# Patient Record
Sex: Female | Born: 1975 | Hispanic: Yes | Marital: Single | Smoking: Never smoker
Health system: Southern US, Community
[De-identification: ages and names within clinical notes are randomized; demographics above are authoritative.]

---

## 2015-01-11 ENCOUNTER — Encounter (HOSPITAL_COMMUNITY): Payer: Self-pay | Admitting: *Deleted

## 2015-01-11 ENCOUNTER — Inpatient Hospital Stay (HOSPITAL_COMMUNITY)
Admission: AD | Admit: 2015-01-11 | Discharge: 2015-01-12 | DRG: 779 | Disposition: A | Discharge status: Home / Self Care | Payer: Medicaid Other | Source: Ambulatory Visit | Attending: Obstetrics & Gynecology | Admitting: Obstetrics & Gynecology

## 2015-01-11 ENCOUNTER — Inpatient Hospital Stay (HOSPITAL_COMMUNITY): Payer: Medicaid Other

## 2015-01-11 DIAGNOSIS — O09529 Supervision of elderly multigravida, unspecified trimester: Secondary | ICD-10-CM

## 2015-01-11 DIAGNOSIS — IMO0002 Reserved for concepts with insufficient information to code with codable children: Secondary | ICD-10-CM

## 2015-01-11 DIAGNOSIS — Z603 Acculturation difficulty: Secondary | ICD-10-CM

## 2015-01-11 DIAGNOSIS — Z3A18 18 weeks gestation of pregnancy: Secondary | ICD-10-CM | POA: Diagnosis present

## 2015-01-11 DIAGNOSIS — Z789 Other specified health status: Secondary | ICD-10-CM

## 2015-01-11 DIAGNOSIS — O021 Missed abortion: Principal | ICD-10-CM

## 2015-01-11 LAB — URINALYSIS, ROUTINE W REFLEX MICROSCOPIC
Bilirubin Urine: NEGATIVE
Glucose, UA: NEGATIVE mg/dL
Ketones, ur: NEGATIVE mg/dL
NITRITE: NEGATIVE
Protein, ur: NEGATIVE mg/dL
SPECIFIC GRAVITY, URINE: 1.025 (ref 1.005–1.030)
Urobilinogen, UA: 0.2 mg/dL (ref 0.0–1.0)
pH: 6 (ref 5.0–8.0)

## 2015-01-11 LAB — URINE MICROSCOPIC-ADD ON

## 2015-01-11 LAB — CBC
HCT: 39.3 % (ref 36.0–46.0)
HEMOGLOBIN: 13.5 g/dL (ref 12.0–15.0)
MCH: 33.2 pg (ref 26.0–34.0)
MCHC: 34.4 g/dL (ref 30.0–36.0)
MCV: 96.6 fL (ref 78.0–100.0)
Platelets: 171 10*3/uL (ref 150–400)
RBC: 4.07 MIL/uL (ref 3.87–5.11)
RDW: 13.4 % (ref 11.5–15.5)
WBC: 10.2 10*3/uL (ref 4.0–10.5)

## 2015-01-11 LAB — WET PREP, GENITAL
Trich, Wet Prep: NONE SEEN
Yeast Wet Prep HPF POC: NONE SEEN

## 2015-01-11 LAB — POCT PREGNANCY, URINE: PREG TEST UR: POSITIVE — AB

## 2015-01-11 LAB — ABO/RH: ABO/RH(D): O POS

## 2015-01-11 IMAGING — US US OB COMP +14 WK
1 series · 13 of 28 positions shown · non-contrast
Comparison: none

[Series 1: us ob comp +14 wk mfm · 31 acquisitions, 13 frames shown]
[im 2/31]
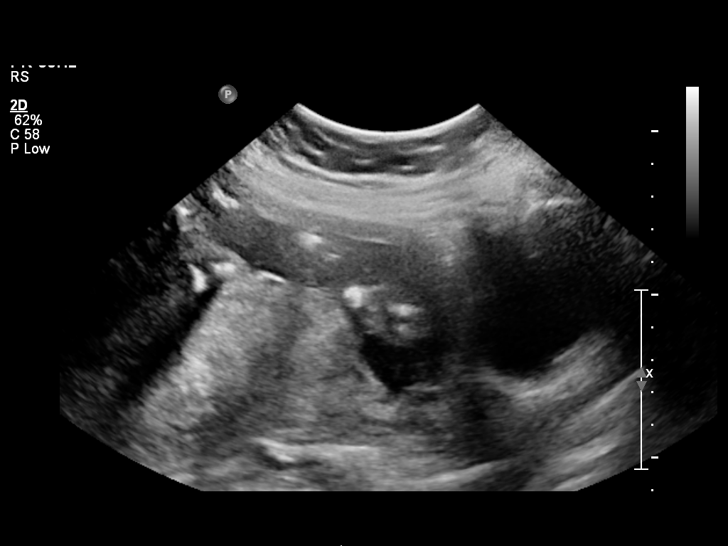
[im 4/31]
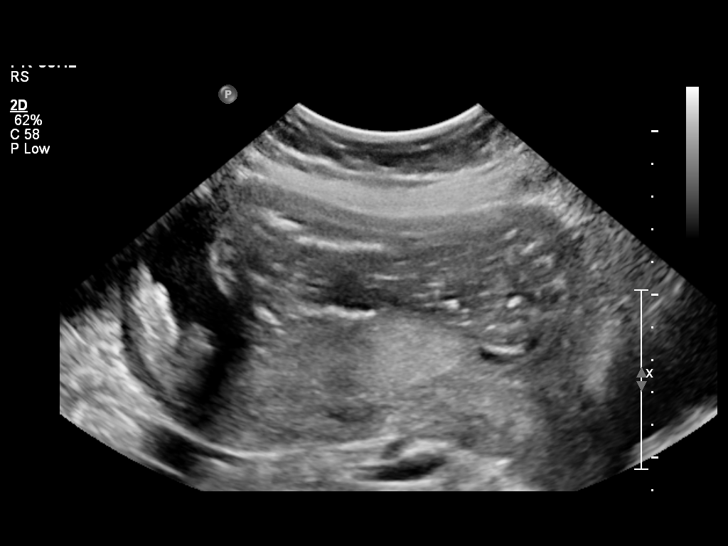
[im 6/31]
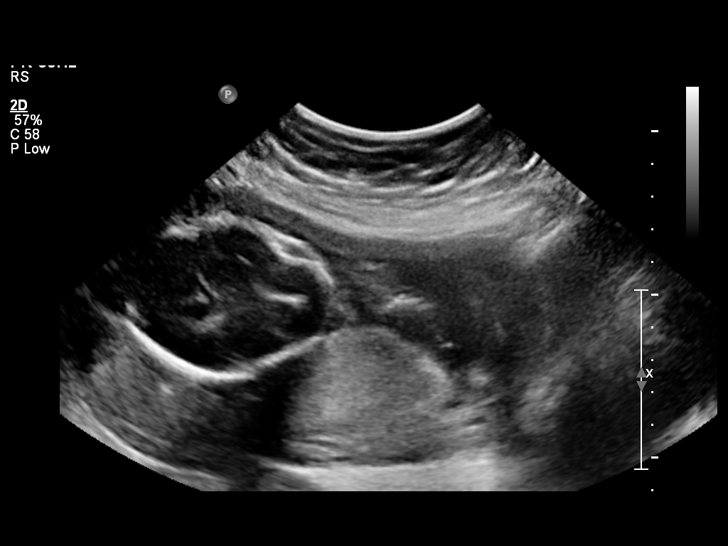
[im 8/31]
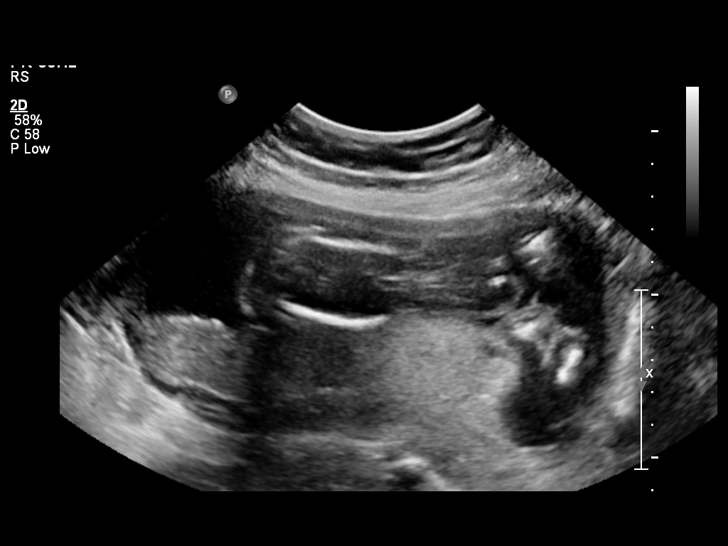
[im 11/31]
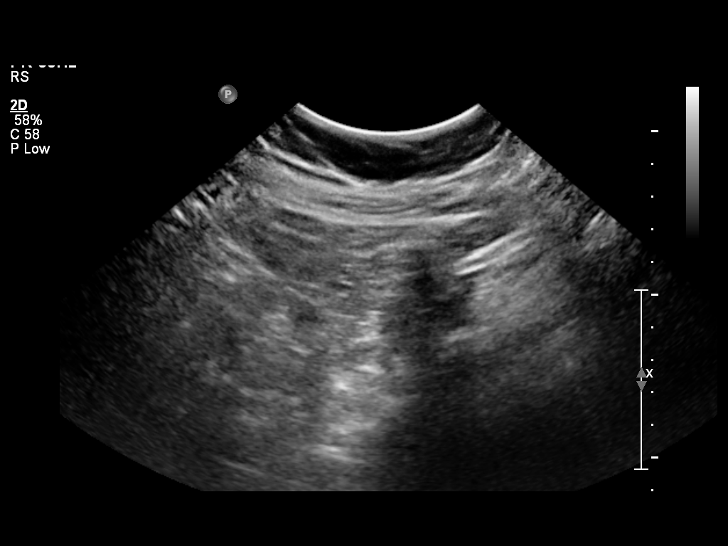
[im 13/31]
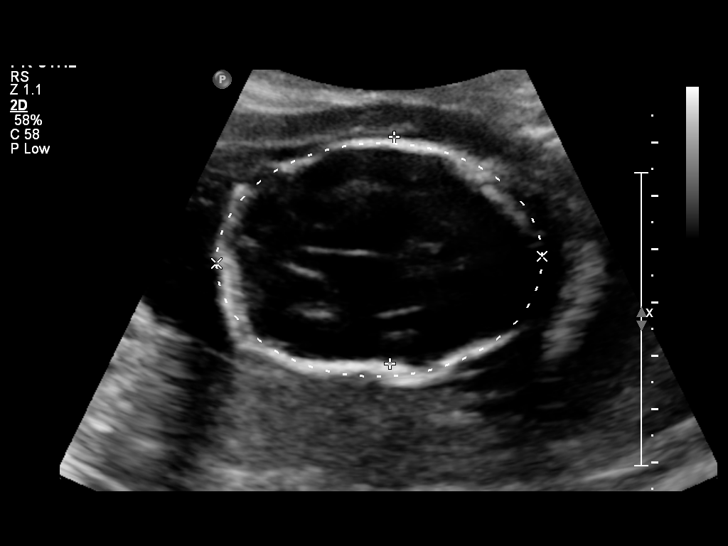
[im 16/31]
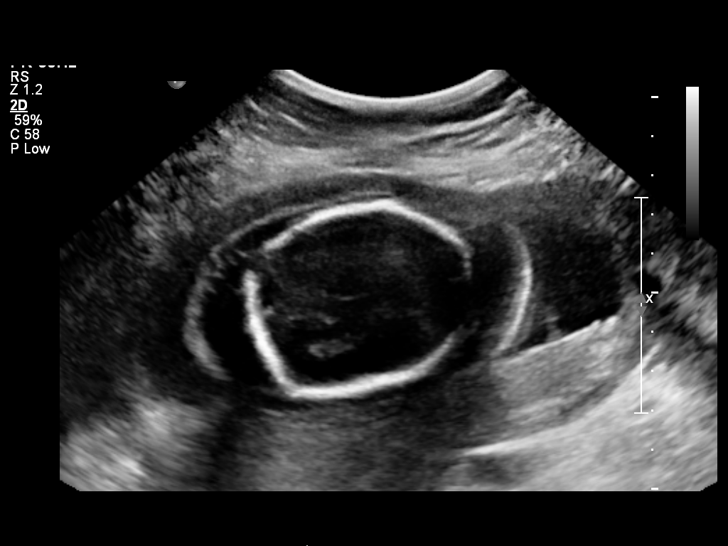
[im 18/31]
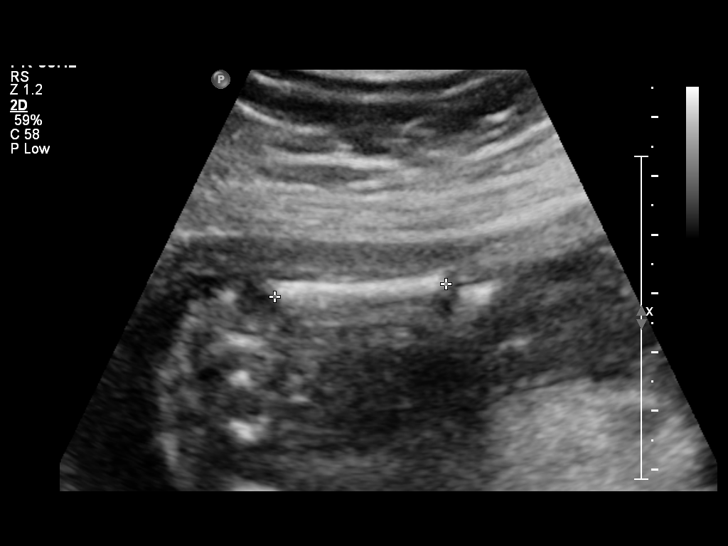
[im 21/31]
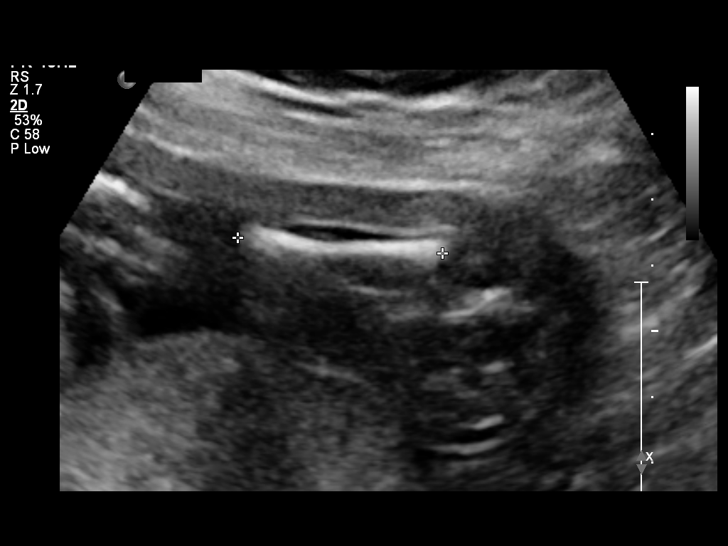
[im 23/31]
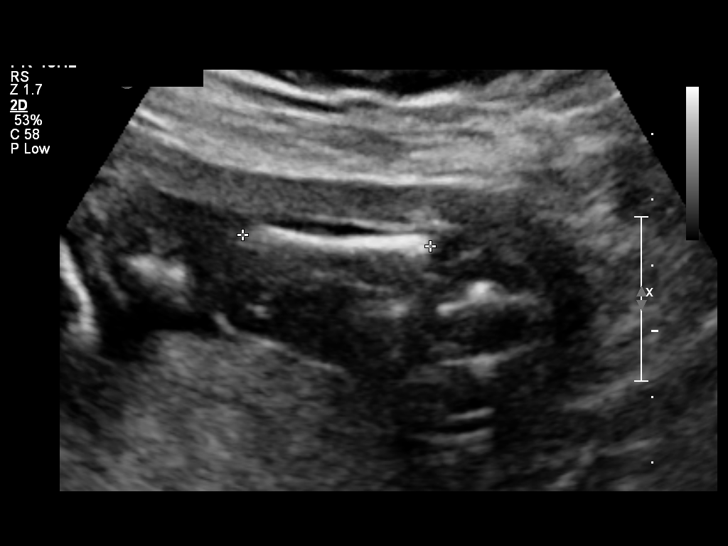
[im 25/31]
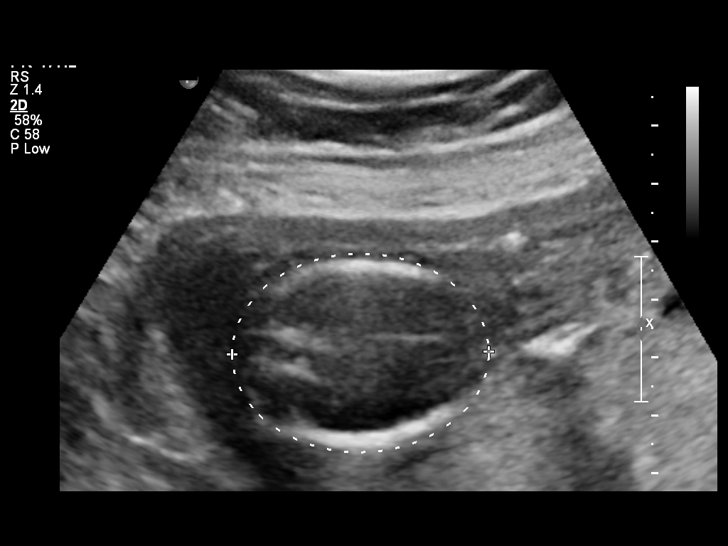
[im 27/31]
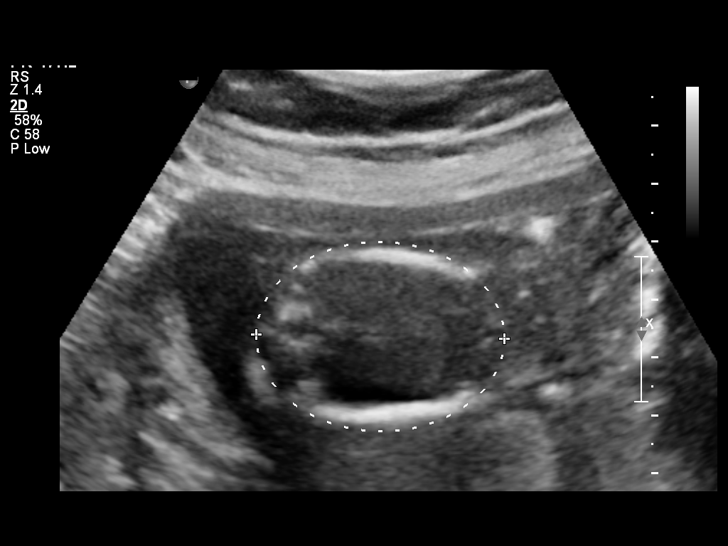
[im 29/31]
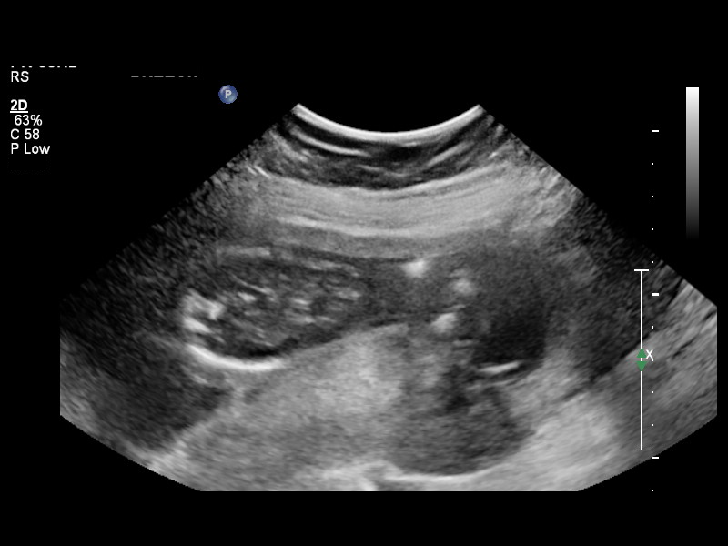

[13 of 28 positions shown; findings below may reference images not displayed]

OBSTETRICS REPORT
                      (Signed Final 01/11/2015 [DATE])

             ROBLES

                                                         PA
Service(s) Provided

 US OB COMP + 14 WK                                    76805.1
Indications

 18 weeks gestation of pregnancy
 Advanced maternal age multigravida 35+, second
 trimester
 Vaginal bleeding in pregnancy, second trimester
 Abdominal pain in pregnancy
 Absent fetal heart tones
 Fetal demise less than 22 weeks
Fetal Evaluation

 Num Of Fetuses:    1
 Cardiac Activity:  Absent
 Presentation:      Breech
 Placenta:          Posterior, above cervical
                    os

 Amniotic Fluid
 AFI FV:      Subjectively within normal limits
                                             Larg Pckt:     4.2  cm
Biometry

 BPD:     42.5  mm     G. Age:  18w 6d                CI:         65.5   70 - 86
                                                      FL/HC:      17.7   16.1 -

 HC:     168.6  mm     G. Age:  19w 4d       73   %   HC/AC:      1.39   1.09 -

 AC:     121.5  mm     G. Age:  17w 6d       17   %   FL/BPD:
 FL:      29.9  mm     G. Age:  19w 2d       57   %   FL/AC:      24.6   20 - 24
 HUM:     29.8  mm     G. Age:  19w 6d       75   %

 Est. FW:     248   gm     0 lb 9 oz     40  %
Gestational Age

 U/S Today:     18w 6d                                        EDD:   06/08/15
 Best:          18w 6d     Det. By:  U/S (01/11/15)           EDD:   06/08/15
Cervix Uterus Adnexa
 Cervical Length:    2.8       cm

 Cervix:       Normal appearance by transabdominal scan.

 Left Ovary:    Not visualized. No adnexal mass visualized.
 Right Ovary:   Not visualized. No adnexal mass visualized.
Impression

 Singleton intrauterine pregnancy at 18 weeks 6 days
 gestation with NO fetal cardiac activity
 Normal appearing fetal growth with subjectively low amniotic
 fluid volume
 Breech presentation
 Posterior placenta without evidence of previa
 Edema of the fetal scalp noted without evidence of cystic
 hygroma
Recommendations

 Follow-up as clinically indicated for second trimester fetal
 demise

                  Siffer, Kiss Attila

## 2015-01-11 MED ORDER — KETOROLAC TROMETHAMINE 60 MG/2ML IM SOLN
60.0000 mg | Freq: Once | INTRAMUSCULAR | Status: AC
  Administered 2015-01-11: 60 mg via INTRAMUSCULAR
  Filled 2015-01-11: qty 2

## 2015-01-11 MED ORDER — LORAZEPAM 1 MG PO TABS
1.0000 mg | ORAL_TABLET | Freq: Once | ORAL | Status: AC
  Administered 2015-01-11: 1 mg via ORAL
  Filled 2015-01-11: qty 1

## 2015-01-11 MED ORDER — MISOPROSTOL 200 MCG PO TABS
200.0000 ug | ORAL_TABLET | ORAL | Status: DC
  Administered 2015-01-11 – 2015-01-12 (×3): 200 ug via VAGINAL
  Filled 2015-01-11 (×3): qty 1

## 2015-01-11 MED ORDER — DEXTROSE IN LACTATED RINGERS 5 % IV SOLN
INTRAVENOUS | Status: DC
  Administered 2015-01-11: 22:00:00 via INTRAVENOUS

## 2015-01-11 MED ORDER — PROMETHAZINE HCL 25 MG/ML IJ SOLN: 25.0000 mg | Freq: Four times a day (QID) | INTRAMUSCULAR | Status: DC | PRN

## 2015-01-11 MED ORDER — HYDROMORPHONE HCL 1 MG/ML IJ SOLN
2.0000 mg | INTRAMUSCULAR | Status: DC | PRN
  Administered 2015-01-12: 2 mg via INTRAVENOUS
  Filled 2015-01-11: qty 2

## 2015-01-11 NOTE — MAU Note (Signed)
Pain from back around to front. Feels like she needs to have BM,doesn't actually go. Having brown d/c also started 2 days ago- has gotten heavier.

## 2015-01-11 NOTE — Progress Notes (Signed)
Up to BR. Philipp DeputyKim Shaw, CNM to come and see patient.

## 2015-01-11 NOTE — MAU Note (Signed)
Arrived from [place] 2 months ago.  No care here in [place]. Other babies all c/s

## 2015-01-11 NOTE — Progress Notes (Signed)
Philipp DeputyKim Shaw, CNM in to see patient. No FHT's detected with doppler. Bonnye FavaViria, interpretor present for discussion regarding current status and plan of care. Informed patient she will have an U/S to determine her gestational age.

## 2015-01-11 NOTE — Progress Notes (Signed)
I assisted Software engineerusan RN with some questions, by Orlan LeavensViria Alvarez Spanish Interpreter

## 2015-01-11 NOTE — MAU Provider Note (Signed)
History     CSN: [id number]  Arrival date and time: 01/11/15 1542   First Provider Initiated Contact with Patient 01/11/15 1924      Chief Complaint  Patient presents with  . Vaginal Discharge  . Abdominal Pain  . Back Pain   HPI Comments: Teresa Espinoza 38 y.o. [id number] 18 weeks 6 days pregnant with brown discharge x 2 days and bright red bleeding today. She also has pelvic pains 4/10 and would like something for pain. [facility]. There is some discrepancy with her dating. She does not speak [place] and an interpreter was used.   Vaginal Discharge Associated symptoms include abdominal pain and back pain.  Abdominal Pain  Back Pain Associated symptoms include abdominal pain.      History reviewed. No pertinent past medical history.  Past Surgical History  Procedure Laterality Date  . Cesarean section      No family history on file.  History  Substance Use Topics  . Smoking status: Never Smoker   . Smokeless tobacco: Never Used  . Alcohol Use: No    Allergies:  Allergies  Allergen Reactions  . Penicillins Rash    Rash and itching    Prescriptions prior to admission  Medication Sig Dispense Refill Last Dose  . Prenatal Vit-Fe Fumarate-FA (PRENATAL MULTIVITAMIN) TABS tablet Take 1 tablet by mouth daily at 12 noon.   01/11/2015 at Unknown time    Review of Systems  Constitutional: Negative.   HENT: Negative.   Eyes: Negative.   Respiratory: Negative.   Cardiovascular: Negative.   Gastrointestinal: Positive for abdominal pain.  Genitourinary: Negative.   Musculoskeletal: Positive for back pain.  Skin: Negative.   Neurological: Negative.   Psychiatric/Behavioral: Negative.    Physical Exam   Blood pressure 139/80, pulse 74, temperature 98.7 F (37.1 C), temperature source Oral, resp. rate 18, height 4' 9.5" (1.461 m), weight 73.029 kg (161 lb), SpO2 100 %.  Physical Exam  Constitutional: She is  oriented to person, place, and time. She appears well-developed and well-nourished. No distress.  Tearful  HENT:  Head: Normocephalic and atraumatic.  Eyes: Pupils are equal, round, and reactive to light.  GI: Soft. She exhibits no distension. There is no tenderness. There is no rebound.  Genitourinary:  Genital: external may have some surgical incisions  Vaginal: small amount bleeding Cervix: closed/ thick Bimanual: pregnant approx 20 weeks   Musculoskeletal: Normal range of motion.  Neurological: She is alert and oriented to person, place, and time.  Skin: Skin is warm and dry.  Psychiatric: She has a normal mood and affect. Her behavior is normal. Judgment and thought content normal.   Results for orders placed or performed during the hospital encounter of 01/11/15 (from the past 24 hour(s))  Urinalysis, Routine w reflex microscopic     Status: Abnormal   Collection Time: 01/11/15  4:05 PM  Result Value Ref Range   Color, Urine YELLOW YELLOW   APPearance CLEAR CLEAR   Specific Gravity, Urine 1.025 1.005 - 1.030   pH 6.0 5.0 - 8.0   Glucose, UA NEGATIVE NEGATIVE mg/dL   Hgb urine dipstick LARGE (A) NEGATIVE   Bilirubin Urine NEGATIVE NEGATIVE   Ketones, ur NEGATIVE NEGATIVE mg/dL   Protein, ur NEGATIVE NEGATIVE mg/dL   Urobilinogen, UA 0.2 0.0 - 1.0 mg/dL   Nitrite NEGATIVE NEGATIVE   Leukocytes, UA MODERATE (A) NEGATIVE  Urine microscopic-add on     Status: Abnormal   Collection Time:  01/11/15  4:05 PM  Result Value Ref Range   Squamous Epithelial / LPF MANY (A) RARE   WBC, UA 7-10 <3 WBC/hpf   RBC / HPF 3-6 <3 RBC/hpf   Bacteria, UA MANY (A) RARE   Urine-Other MUCOUS PRESENT   Pregnancy, urine POC     Status: Abnormal   Collection Time: 01/11/15  4:55 PM  Result Value Ref Range   Preg Test, Ur POSITIVE (A) NEGATIVE  Wet prep, genital     Status: Abnormal   Collection Time: 01/11/15  8:00 PM  Result Value Ref Range   Yeast Wet Prep HPF POC NONE SEEN NONE SEEN    Trich, Wet Prep NONE SEEN NONE SEEN   Clue Cells Wet Prep HPF POC FEW (A) NONE SEEN   WBC, Wet Prep HPF POC FEW (A) NONE SEEN     MAU Course  Procedures  MDM CBC, ABORh, HIV, GC, Chlamydia Toradol 60 mg IM Ativan 1 mg po Spoke with Dr Despina Hidden who advised admission for Cytotec Pt agreed to have Cytotec [name] at bedside  Assessment and Plan   A: [redacted]w[redacted]d demise  P: Admit to AICU for Cytotec   Carolynn Serve 01/11/2015, 8:04 PM

## 2015-01-11 NOTE — Progress Notes (Signed)
No FHT's detected. No uterine fundus palpated. Patient states she is 32 weeks. States [place], was told everything was normal.

## 2015-01-11 NOTE — Progress Notes (Signed)
Patient gave permission for sister to have a work note in her name.

## 2015-01-12 ENCOUNTER — Encounter (HOSPITAL_COMMUNITY): Payer: Self-pay | Admitting: *Deleted

## 2015-01-12 DIAGNOSIS — O021 Missed abortion: Secondary | ICD-10-CM | POA: Diagnosis not present

## 2015-01-12 DIAGNOSIS — IMO0002 Reserved for concepts with insufficient information to code with codable children: Secondary | ICD-10-CM | POA: Diagnosis present

## 2015-01-12 DIAGNOSIS — Z3A19 19 weeks gestation of pregnancy: Secondary | ICD-10-CM

## 2015-01-12 DIAGNOSIS — R109 Unspecified abdominal pain: Secondary | ICD-10-CM | POA: Diagnosis not present

## 2015-01-12 DIAGNOSIS — O364XX0 Maternal care for intrauterine death, not applicable or unspecified: Secondary | ICD-10-CM

## 2015-01-12 DIAGNOSIS — Z3A18 18 weeks gestation of pregnancy: Secondary | ICD-10-CM | POA: Diagnosis present

## 2015-01-12 LAB — MRSA PCR SCREENING: MRSA BY PCR: NEGATIVE

## 2015-01-12 MED ORDER — MISOPROSTOL 200 MCG PO TABS
800.0000 ug | ORAL_TABLET | Freq: Once | ORAL | Status: AC
  Administered 2015-01-12: 800 ug via VAGINAL

## 2015-01-12 MED ORDER — IBUPROFEN 600 MG PO TABS
600.0000 mg | ORAL_TABLET | Freq: Once | ORAL | Status: AC
  Administered 2015-01-12: 600 mg via ORAL
  Filled 2015-01-12: qty 1

## 2015-01-12 MED ORDER — MISOPROSTOL 200 MCG PO TABS
ORAL_TABLET | ORAL | Status: AC
  Filled 2015-01-12: qty 4

## 2015-01-12 MED ORDER — IBUPROFEN 600 MG PO TABS: 600.0000 mg | ORAL_TABLET | Freq: Four times a day (QID) | ORAL | Status: AC | PRN

## 2015-01-12 NOTE — Progress Notes (Signed)
Asked to visit this patient when on another visit and spoke though an a very professional interpreter (Spanish).  Patient shared her husband is in [place] and does not know at this writing that his baby is deceased.  He does know that his wife has gone to the hospital/doctor.  Apparently, he is under the impression the baby is seven months, and not the 19 weeks the development of the baby indicates.    We discussed questions of guilt, fault, Why? and verbalized and affirmed her feelings.  She was very tearful.  We prayed together, and talked of God not stopping the events of life but always being there, and affirmed her worth in God's eyes.  These conversations appeared to comfort her some, and she had asked for the Chaplain and that in itself brought her some assurance.  Patient's sister eventually arrived.  Patient stated she had 3 sisters in town and they are very supportive.  She also has two children, 10 and 13.  This was her second consecutive miscarriage.  Rema Jasmineichard Autumn Pruitt, Chaplain Pager: [phone]

## 2015-01-12 NOTE — Progress Notes (Signed)
A non-viable fetus was delivered at 0150 AM.  Apgar 0/0 . MD at bedside.

## 2015-01-12 NOTE — Discharge Summary (Signed)
Physician Discharge Summary  Patient ID: Teresa KnudsenSandra Espinoza MRN: [id number] DOB/AGE: 01-17-1976 38 y.o.  Admit date: 01/11/2015 Discharge date: 01/12/2015  Admission Diagnoses: Intrauterine fetal demise at 19 weeks Discharge Diagnoses: s/p SVD of 19 week fetal demise Active Problems:   AMA (advanced maternal age) multigravida 35+   Fetal demise   Language barrier   Missed ab   Discharged Condition: good  Hospital Course: 39 yo presenting with vaginal bleeding and abdominal pain was noted to have a 19-week intrauterine fetal demise. Induction of labor with cytotec allowed for the delivery of a foul smelling fetus with purulent discharge. Patient had limited prenatal care in the [place] and had a few visits while in [place]. Patient is doing well on the day of discharge without complaints. Discharge instructions were reviewed and the [facility]  Consults: None   Treatments: induction of labor  Discharge Exam: Blood pressure 104/54, pulse 77, temperature 98.6 F (37 C), temperature source Oral, resp. rate 16, height 4' 9.5" (1.461 m), weight 161 lb (73.029 kg), SpO2 98 %. General appearance: alert, cooperative and no distress Resp: clear to auscultation bilaterally Cardio: regular rate and rhythm GI: soft, non-tender; bowel sounds normal; no masses,  no organomegaly Extremities: Homans sign is negative, no sign of DVT  Disposition: Final discharge disposition not confirmed     Medication List    TAKE these medications        ibuprofen 600 MG tablet  Commonly known as:  ADVIL,MOTRIN  Take 1 tablet (600 mg total) by mouth every 6 (six) hours as needed.     prenatal multivitamin Tabs tablet  Take 1 tablet by mouth daily at 12 noon.       Follow-up Information    [facility].   Specialty:  Obstetrics and Gynecology   Contact information:   [address] [place]  [id number] [phone]      Signed: Catalina AntiguaCONSTANT,Nessa Ramaker 01/12/2015, 9:49 AM

## 2015-01-12 NOTE — Progress Notes (Signed)
Placenta delivered intact. Will be sent to Pathology. Pt resting comfortable in bed. Sister at her side. Patient held fetus for an extended time. Pictures taken and memory kit was given to her. Baby was left for hospital to take  care of. Spanish interpreter translated for [place]. Pt was supported, comforted, washed and settled comfortable in bed. We will continue to monitor.

## 2015-01-12 NOTE — Progress Notes (Signed)
Utilization Review completed.  

## 2015-01-12 NOTE — Progress Notes (Signed)
Patient became overly excited after receiving Dilaudid 1 mg IV. Writer wasted the other 1mg  with Owens CorningLisa- House Supervisor.

## 2015-01-12 NOTE — Progress Notes (Signed)
Patient ID: Teresa KnudsenSandra Bernal-Robles, female   DOB: Apr 16, 1976, 39 y.o.   MRN: [id number]  Accompanied Dr Richarda BladeAdamo to room for eval of pt after SVD of 18wk IUFD. Delivery @ 0130 by RN. Room is malodorous with smell of fetus. Thin umb cord at introitus. Bldg  Minimal. No traction placed on cord. Cytotec 600mcg placed rectally with spont del of placenta @ 0530. Pt with firm fundus and nl lochia. CNM unable to inspect placenta for completeness as it  Was taken to path shortly after delivery, but per RN eval it appeared to be such.  [name], [name] 01/12/2015 7:29 AM

## 2015-01-13 ENCOUNTER — Other Ambulatory Visit: Payer: Self-pay | Admitting: Obstetrics & Gynecology

## 2015-01-13 MED ORDER — SULFAMETHOXAZOLE-TRIMETHOPRIM 800-160 MG PO TABS: 1.0000 | ORAL_TABLET | Freq: Two times a day (BID) | ORAL | Status: DC

## 2015-01-14 LAB — CULTURE, OB URINE: SPECIAL REQUESTS: NORMAL

## 2015-01-14 LAB — GC/CHLAMYDIA PROBE AMP (~~LOC~~) NOT AT ARMC
CHLAMYDIA, DNA PROBE: NEGATIVE
Neisseria Gonorrhea: NEGATIVE

## 2015-01-14 LAB — HIV ANTIBODY (ROUTINE TESTING W REFLEX): HIV Screen 4th Generation wRfx: NONREACTIVE

## 2015-01-17 ENCOUNTER — Encounter (HOSPITAL_COMMUNITY): Payer: Self-pay | Admitting: *Deleted

## 2015-01-21 ENCOUNTER — Encounter: Payer: Self-pay | Admitting: Obstetrics & Gynecology

## 2015-02-04 ENCOUNTER — Ambulatory Visit (INDEPENDENT_AMBULATORY_CARE_PROVIDER_SITE_OTHER): Payer: Self-pay | Admitting: Family Medicine

## 2015-02-04 ENCOUNTER — Encounter: Payer: Self-pay | Admitting: Family Medicine

## 2015-02-04 VITALS — BP 118/63 | HR 75 | Temp 98.4°F | Wt 158.8 lb

## 2015-02-04 DIAGNOSIS — F329 Major depressive disorder, single episode, unspecified: Secondary | ICD-10-CM

## 2015-02-04 DIAGNOSIS — F32A Depression, unspecified: Secondary | ICD-10-CM

## 2015-02-04 MED ORDER — SERTRALINE HCL 50 MG PO TABS: 50.0000 mg | ORAL_TABLET | Freq: Every day | ORAL | Status: AC

## 2015-02-04 MED ORDER — FLUCONAZOLE 150 MG PO TABS: 150.0000 mg | ORAL_TABLET | Freq: Once | ORAL | Status: AC

## 2015-02-04 NOTE — Progress Notes (Signed)
Here for postpartum visit. Used interpreter [employer]saquel Mora. Does not want birth control for now as husband is not here

## 2015-02-04 NOTE — Patient Instructions (Addendum)
Depresin (Depression) La depresin es un sentimiento de tristeza, decaimiento, sufrimiento espiritual, melancola, pesimismo o vaco. Hay dos tipos de depresin:  Tristeza o afliccin normal. Ocurre despus de un suceso que Publishing rights managerprovoca malestar. Generalmente desaparece sin tratamiento dentro de las 2 semanas. Despus de perder un ser querido (duelo), la tristeza o afliccin normales pueden durar ms de Marsh & McLennandos semanas. Generalmente mejora al pasar Allied Waste Industriesel tiempo.  Depresin clnica. Dura ms que la tristeza o afliccin normal. Impide realizar las cosas habituales de la vida. Causa dificultades para actuar en el hogar, el trabajo o la escuela. Puede afectar las relaciones con los dems. A menudo requiere tratamiento. SOLICITE AYUDA DE INMEDIATO SI:  Tiene pensamientos acerca de lastimarse o daar a Economistotras personas.  Pierde el contacto con la realidad (sntomas psicticos). Puede ocurrirle que:  Vea o escuche cosas que no existen.  Tenga creencias falsas sobre su vida o sobre las personas que lo rodean.  Los Toys ''R'' Usmedicamentos le produzcan trastornos. ASEGRESE DE QUE:  Comprende estas instrucciones.  Controlar su afeccin.  Recibir ayuda de inmediato si no mejora o si empeora. Document Released: 02/24/2011 Document Revised: 03/26/2014 [facility] Patient Information 2015 [place], [place]. This information is not intended to replace advice given to you by your health care provider. Make sure you discuss any questions you have with your health care provider.    Vulvovaginitis Candidisica (Candidal Vulvovaginitis) La vulvovaginitis candidisica es una infeccin de la vagina y la vulva. La vulva es la piel que rodea la abertura de la vagina. Puede causar picazn y [place] Islandsmolestias dentro de y alrededor de la vagina.  CUIDADOS EN EL HOGAR  Slo tome medicamentos como lo indique su mdico.  No mantenga relaciones sexuales hasta que la infeccin haya curado o segn le indique el mdico.  Practique sexo  seguro.  Informe a su compaero sexual acerca de su infeccin.  No tome duchas vaginales ni use tampones.  Use ropa interior de algodn. No utilice pantalones ni pantimedias ajustados.  Coma yogur. Esto puede ayudar a tratar y prevenir las infecciones por cndida. SOLICITE AYUDA DE INMEDIATO SI:   Tiene fiebre.  Los problemas empeoran durante el tratamiento, o si no mejora luego de [address] das.  Tiene malestar, irritacin, o picazn en la zona de la vagina o la vulva.  Siente dolor en al Gannett Comantener relaciones sexuales.  Comienza a sentir dolor abdominal. ASEGRESE DE QUE:  Comprende estas instrucciones.  Controlar su enfermedad.  Solicitar ayuda de inmediato si no mejora o empeora. Document Released: 12/12/2010 Document Revised: 11/14/2013 [facility] Patient Information 2015 [place], [place]. This information is not intended to replace advice given to you by your health care provider. Make sure you discuss any questions you have with your health care provider.

## 2015-02-06 ENCOUNTER — Telehealth: Payer: Self-pay

## 2015-02-06 NOTE — Telephone Encounter (Signed)
-----   Message from Fredirick LatheKristy Acosta, MD sent at 02/06/2015 12:27 AM EDT ----- Pt needs postpartum pap, did not do in clinic and [facility], can you please refer her.  Spanish only  Perry MountACOSTA,KRISTY ROCIO, MD

## 2015-02-06 NOTE — Progress Notes (Signed)
Subjective:     Teresa KnudsenSandra Bernal-Robles is a 39 y.o. female who presents for a postpartum visit. She is 4 weeks postpartum following a spontaneous vaginal delivery. I have fully reviewed the prenatal and intrapartum course. The delivery was at 19 gestational weeks after cytotec 2/2 fetal demise. Bleeding no bleeding. Bowel function is normal. Bladder function is normal. Patient is not sexually active. Contraception method is none. Postpartum depression screening: positive. - +S+I+G+E c +A p s The following portions of the patient's history were reviewed and updated as appropriate: allergies, current medications, past family history, past medical history, past social history, past surgical history and problem list.  Review of Systems Pertinent items are noted in HPI.   Objective:    BP 118/63 mmHg  Pulse 75  Temp(Src) 98.4 F (36.9 C)  Wt 158 lb 12.8 oz (72.031 kg)  General:  alert and cooperative   Breasts:  not examined  Lungs: normal effort  Heart:  normal rate  Abdomen: soft, non-tender; bowel sounds normal; no masses,  no organomegaly   Vulva:  not evaluated  Vagina: not evaluated        Assessment:     Normal postpartum exam with postpartum depression. Pap smear not done at today's visit.   Plan:    1. Contraception: declines, [place] currently in [place] 2. Postpartum depression: start Zoloft, return to clinic in 4-6 weeks to follow up 3. [facility]

## 2015-02-06 NOTE — Telephone Encounter (Addendum)
Patient needs to be referred to free pap screening [phone]. [facility] patient with [facility] [id number]. Attempted to contact patient. No answer. Left message stating we are trying to reach you, please call clinic.

## 2015-02-12 NOTE — Telephone Encounter (Signed)
Called patient with Teresa PerkingMaria Espinoza for interpreter and discussed importance of getting pap smear and provided patient the free pap screening number. Patient verbalized understanding and had no questions

## 2021-01-06 ENCOUNTER — Other Ambulatory Visit: Payer: Self-pay | Admitting: Oncology

## 2021-01-13 ENCOUNTER — Other Ambulatory Visit: Payer: Self-pay | Admitting: Oncology
